# Patient Record
Sex: Male | Born: 1955 | Race: White | Hispanic: No | Marital: Married | Smoking: Never smoker
Health system: Southern US, Community
[De-identification: ages and names within clinical notes are randomized; demographics above are authoritative.]

## PROBLEM LIST (undated history)

## (undated) DIAGNOSIS — R911 Solitary pulmonary nodule: Secondary | ICD-10-CM

## (undated) HISTORY — DX: Solitary pulmonary nodule: R91.1

## (undated) HISTORY — PX: VASECTOMY: SHX75

---

## 1991-06-10 HISTORY — PX: FRACTURE SURGERY: SHX138

## 1991-06-10 HISTORY — PX: SPINE SURGERY: SHX786

## 1999-06-24 ENCOUNTER — Ambulatory Visit (HOSPITAL_BASED_OUTPATIENT_CLINIC_OR_DEPARTMENT_OTHER): Admission: RE | Admit: 1999-06-24 | Discharge: 1999-06-24 | Payer: Self-pay | Admitting: Orthopedic Surgery

## 2003-06-10 HISTORY — PX: FRACTURE SURGERY: SHX138

## 2006-01-21 ENCOUNTER — Ambulatory Visit: Payer: Self-pay | Admitting: Cardiology

## 2009-07-19 LAB — HM COLONOSCOPY

## 2010-06-30 ENCOUNTER — Encounter: Payer: Self-pay | Admitting: Cardiology

## 2013-09-19 ENCOUNTER — Encounter: Payer: Self-pay | Admitting: Physician Assistant

## 2013-09-19 ENCOUNTER — Ambulatory Visit (INDEPENDENT_AMBULATORY_CARE_PROVIDER_SITE_OTHER): Payer: BC Managed Care – PPO | Admitting: Physician Assistant

## 2013-09-19 VITALS — BP 124/84 | HR 84 | Temp 98.1°F | Resp 18 | Ht 68.0 in | Wt 204.0 lb

## 2013-09-19 DIAGNOSIS — Z0000 Encounter for general adult medical examination without abnormal findings: Secondary | ICD-10-CM

## 2013-09-19 DIAGNOSIS — Z1212 Encounter for screening for malignant neoplasm of rectum: Secondary | ICD-10-CM

## 2013-09-19 DIAGNOSIS — R1032 Left lower quadrant pain: Secondary | ICD-10-CM

## 2013-09-19 DIAGNOSIS — R198 Other specified symptoms and signs involving the digestive system and abdomen: Secondary | ICD-10-CM

## 2013-09-19 DIAGNOSIS — Z125 Encounter for screening for malignant neoplasm of prostate: Secondary | ICD-10-CM

## 2013-09-19 DIAGNOSIS — Z1211 Encounter for screening for malignant neoplasm of colon: Secondary | ICD-10-CM

## 2013-09-19 LAB — COMPLETE METABOLIC PANEL WITH GFR
ALT: 20 U/L (ref 0–53)
AST: 20 U/L (ref 0–37)
Albumin: 4.1 g/dL (ref 3.5–5.2)
Alkaline Phosphatase: 59 U/L (ref 39–117)
BUN: 13 mg/dL (ref 6–23)
CO2: 25 mEq/L (ref 19–32)
Calcium: 9.5 mg/dL (ref 8.4–10.5)
Chloride: 104 mEq/L (ref 96–112)
Creat: 1.04 mg/dL (ref 0.50–1.35)
GFR, Est African American: >89 mL/min
GFR, Est Non African American: 80 mL/min
Glucose, Bld: 98 mg/dL (ref 70–99)
Potassium: 4.9 mEq/L (ref 3.5–5.3)
Sodium: 138 mEq/L (ref 135–145)
Total Bilirubin: 0.8 mg/dL (ref 0.2–1.2)
Total Protein: 6.4 g/dL (ref 6.0–8.3)

## 2013-09-19 LAB — CBC WITH DIFFERENTIAL/PLATELET
Basophils Absolute: 0.1 10*3/uL (ref 0.0–0.1)
Basophils Relative: 1 % (ref 0–1)
EOS PCT: 3 % (ref 0–5)
Eosinophils Absolute: 0.2 10*3/uL (ref 0.0–0.7)
HEMATOCRIT: 42.2 % (ref 39.0–52.0)
Hemoglobin: 14.4 g/dL (ref 13.0–17.0)
LYMPHS ABS: 1.8 10*3/uL (ref 0.7–4.0)
LYMPHS PCT: 36 % (ref 12–46)
MCH: 31.7 pg (ref 26.0–34.0)
MCHC: 34.1 g/dL (ref 30.0–36.0)
MCV: 93 fL (ref 78.0–100.0)
Monocytes Absolute: 0.5 10*3/uL (ref 0.1–1.0)
Monocytes Relative: 9 % (ref 3–12)
Neutro Abs: 2.6 10*3/uL (ref 1.7–7.7)
Neutrophils Relative %: 51 % (ref 43–77)
PLATELETS: 284 10*3/uL (ref 150–400)
RBC: 4.54 MIL/uL (ref 4.22–5.81)
RDW: 13.6 % (ref 11.5–15.5)
WBC: 5 10*3/uL (ref 4.0–10.5)

## 2013-09-19 LAB — LIPID PANEL
Cholesterol: 181 mg/dL (ref 0–200)
HDL: 60 mg/dL (ref >39)
LDL Cholesterol: 99 mg/dL (ref 0–99)
Total CHOL/HDL Ratio: 3 Ratio
Triglycerides: 111 mg/dL (ref <150)
VLDL: 22 mg/dL (ref 0–40)

## 2013-09-19 LAB — TSH: TSH: 2.617 u[IU]/mL (ref 0.350–4.500)

## 2013-09-19 NOTE — Progress Notes (Signed)
Patient ID: Benjamin Patrick I Carll MRN: [id number], DOB: 05/21/1956 58 y.o. Date of Encounter: 09/19/2013, 8:52 AM    Chief Complaint: Physical (CPE)  HPI: 58 y.o. y/o white male here for CPE.   Reports that about 3 weeks ago he developed some pain in his left lower quadrant. It was a constant achy discomfort there. Says it has lasted several weeks. Says it has improved but has not completely resolved. Also states that about 3 weeks ago his bowel movements were much narrower and smaller caliber than usual. He never had any diarrhea or loose stools with the pain. Has had no fevers or chills. Has seen no melena or hematochezia.  Otherwise has been feeling good over the past year but no other complaints and medical problems.   Review of Systems: Consitutional: No fever, chills, fatigue, night sweats, lymphadenopathy, or weight changes. Eyes: No visual changes, eye redness, or discharge. ENT/Mouth: Ears: No otalgia, tinnitus, hearing loss, discharge. Nose: No congestion, rhinorrhea, sinus pain, or epistaxis. Throat: No sore throat, post nasal drip, or teeth pain. Cardiovascular: No CP, palpitations, diaphoresis, DOE, edema, orthopnea, PND. Respiratory: No cough, hemoptysis, SOB, or wheezing. Gastrointestinal: No anorexia, dysphagia, reflux, nausea, vomiting, hematemesis, diarrhea, constipation, BRBPR, or melena. Genitourinary: No dysuria, frequency, urgency, hematuria, incontinence, nocturia, decreased urinary stream, discharge, impotence, or testicular pain/masses. Musculoskeletal: No decreased ROM, myalgias, stiffness, joint swelling, or weakness. Skin: No rash, erythema, lesion changes, pain, warmth, jaundice, or pruritis. Neurological: No headache, dizziness, syncope, seizures, tremors, memory loss, coordination problems, or paresthesias. Psychological: No anxiety, depression, hallucinations, SI/HI. Endocrine: No fatigue, polydipsia, polyphagia, polyuria, or known diabetes. All other  systems were reviewed and are otherwise negative.  History reviewed. No pertinent past medical history.   Past Surgical History  Procedure Laterality Date  . Vasectomy    . Fracture surgery Right 06/10/1991    Ankle  . Fracture surgery Right 06/10/2003    Lower Leg  . Fracture surgery Left 06/10/1991    Left Wrist  . Spine surgery N/A 06/10/1991    Lumbar Spine    Home Meds: Only medication is multivitamin. No current outpatient prescriptions on file prior to visit.   No current facility-administered medications on file prior to visit.    Allergies: No Known Allergies  History   Social History  . Marital Status: Married    Spouse Name: N/A    Number of Children: N/A  . Years of Education: N/A   Occupational History  . Not on file.   Social History Main Topics  . Smoking status: Never Smoker   . Smokeless tobacco: Never Used  . Alcohol Use: Yes  . Drug Use: No  . Sexual Activity: Not on file   Other Topics Concern  . Not on file   Social History Narrative   Married. 2 Daughters. 2 Grandchildren.   Works Manufacturing engineerDigging Water Wells.Puts in Pumps   Never Smoked.   No other exercise. --Just YardWork and his job.    Family History  Problem Relation Age of Onset  . Cancer Mother [id number]    Breast Cancer    Physical Exam: Blood pressure 124/84, pulse 84, temperature 98.1 F (36.7 C), temperature source Oral, resp. rate 18, height 5\' 8"  (1.727 m), weight 204 lb (92.534 kg).  General: Well developed, well nourished,WM. Appears in no acute distress. HEENT: Normocephalic, atraumatic. Conjunctiva pink, sclera non-icteric. Pupils 2 mm constricting to 1 mm, round, regular, and equally reactive to light and accomodation. EOMI. Internal auditory canal clear. TMs with  good cone of light and without pathology. Nasal mucosa pink. Nares are without discharge. No sinus tenderness. Oral mucosa pink. Dentition good.Marland Kitchen. Pharynx without exudate.   Neck: Supple. Trachea midline. No thyromegaly. Full  ROM. No lymphadenopathy. Lungs: Clear to auscultation bilaterally without wheezes, rales, or rhonchi. Breathing is of normal effort and unlabored. Cardiovascular: RRR with S1 S2. No murmurs, rubs, or gallops. Distal pulses 2+ symmetrically. No carotid or abdominal bruits. Abdomen: Soft,  non-distended with normoactive bowel sounds. No hepatosplenomegaly or masses. No rebound/guarding. No CVA tenderness. No hernias. Minimal Tenderness Left Lower Quadrant. Rectal: Deferred.  Musculoskeletal: Full range of motion and 5/5 strength throughout. Without swelling, atrophy, tenderness, crepitus, or warmth. Extremities without clubbing, cyanosis, or edema. Calves supple. Skin: Warm and moist without erythema, ecchymosis, wounds, or rash. Neuro: A+Ox3. CN II-XII grossly intact. Moves all extremities spontaneously. Full sensation throughout. Normal gait. DTR 2+ throughout upper and lower extremities. Finger to nose intact. Psych:  Responds to questions appropriately with a normal affect.   Assessment/Plan:  58 y.o. y/o  male here for CPE -1. Visit for preventive health examination  A. Screening Labs: IS fasting.  - CBC with Differential - COMPLETE METABOLIC PANEL WITH GFR - Lipid panel - TSH - Vit D  25 hydroxy (rtn osteoporosis monitoring) - PSA   B. Screening For Prostate Cancer: Check PSA now. Prostate exam deferred.  C. Screening For Colorectal Cancer:  #3 below.  D. Immunizations: Flu-----N/A Tetanus----He received Tdap here 07/2007--UpToDate Pneumococcal--- he has no indication to receive this prior to age 58.- Zostaax----discuss at age 58.    2. Prostate cancer screening - PSA  3. Screening for colorectal cancer [facility] Dr[facility] Wandalee FerdinandSam Ganem  07/19/2009. This showed diverticulosis in the sigmoid colon and in the descending colon. Otherwise normal. Repeat colonoscopy 10 years.  4. Abdominal pain, left lower quadrant I suspect diverticulitis. However  usually with diverticulitis patient's have loose stools. He has had no loose stools and also reports that his stools have been narrowed caliber, Will obtain CT to rule out mass and other etiology and also to possibly make definitive diagnose of diverticulitis . - CT Abdomen Pelvis W Contrast; Future  5. Change in bowel movement - CT Abdomen Pelvis W Contrast; Future   We will call him with results of labs and also results of the CT scan. Otherwise plan followup office visit in one year for her annual physical or sooner if needed.  Signed:   637 Cardinal DriveMary Beth New MarketDixon,PA, [place]  09/19/2013 8:52 AM

## 2013-09-20 LAB — PSA: PSA: 0.61 ng/mL (ref <=4.00)

## 2013-09-20 LAB — VITAMIN D 25 HYDROXY (VIT D DEFICIENCY, FRACTURES): VIT D 25 HYDROXY: 57 ng/mL (ref 30–89)

## 2013-09-22 ENCOUNTER — Ambulatory Visit
Admission: RE | Admit: 2013-09-22 | Discharge: 2013-09-22 | Disposition: A | Discharge status: Home / Self Care | Payer: BC Managed Care – PPO | Source: Ambulatory Visit | Attending: Physician Assistant | Admitting: Physician Assistant

## 2013-09-22 ENCOUNTER — Telehealth: Payer: Self-pay | Admitting: Family Medicine

## 2013-09-22 DIAGNOSIS — R198 Other specified symptoms and signs involving the digestive system and abdomen: Secondary | ICD-10-CM

## 2013-09-22 DIAGNOSIS — R1032 Left lower quadrant pain: Secondary | ICD-10-CM

## 2013-09-22 MED ORDER — IOHEXOL 300 MG/ML  SOLN
125.0000 mL | Freq: Once | INTRAMUSCULAR | Status: AC | PRN
  Administered 2013-09-22: 125 mL via INTRAVENOUS

## 2013-09-22 MED ORDER — HYOSCYAMINE SULFATE ER 0.375 MG PO TB12: 0.3750 mg | ORAL_TABLET | Freq: Two times a day (BID) | ORAL | Status: DC

## 2013-09-22 NOTE — Telephone Encounter (Signed)
Message copied by Donne AnonPLUMMER, KIM M on Thu Sep 22, 2013  4:37 PM ------      Message from: Allayne ButcherIXON, MARY      Created: Thu Sep 22, 2013  4:34 PM       I called patient and spoke with him directly regarding the CT results.      1- he reports that he is still feeling bloated and abdominal pain after he eats.      I discussed that the CT showed no abnormality to explain cause of these symptoms or to explain the symptoms  that he reported at the  visit regarding change in stools.      We also reviewed the fact that he had a colonoscopy 07/19/2009 which showed diverticulosis but otherwise normal.      Therefore,  at this point I recommended that he start an over-the-counter probiotic one daily.      Also recommended that he start prescription medication called hyoscyamine and take as directed.      Told him if symtoms not controlled with these measures, f/u with me and will arrange f/u with Dr. Evette CristalGanem/ his GI            2- also discussed with him that the CT showed an area at the base of the right long that may represent scarring but may represent nodule.      Confirmed with him that he never smoked. Has no family history of lung cancer. He has no personal history of any type of cancer. He has no cough, no hemopytisis.  Therefore he is considered low risk which means that he is to have repeat CT scan in 12 months.            KIM--      Please order for hyoscyamine 0.375mg  one po BID  # 60 +3 refills      Please place reminder to order  CT scan  to be performed in 12 months. ------

## 2013-09-23 NOTE — Telephone Encounter (Signed)
RX to pharmacy and reminder placed for CT

## 2013-10-27 ENCOUNTER — Telehealth: Payer: Self-pay | Admitting: Family Medicine

## 2013-10-27 NOTE — Telephone Encounter (Signed)
Error

## 2013-11-22 ENCOUNTER — Telehealth: Payer: Self-pay | Admitting: Family Medicine

## 2013-11-22 NOTE — Telephone Encounter (Signed)
Error

## 2014-09-13 ENCOUNTER — Telehealth: Payer: Self-pay | Admitting: Family Medicine

## 2014-09-13 NOTE — Telephone Encounter (Signed)
Pt had abnormal CT last year (see result)  Repeat CT recommended for this year.  I called patient to see if he was ready to have this done.  He has an appointment with Dr Tanya NonesPickard on 09/19/14 for CPE. He has opted to discuss this with him at that time.  Will send reminder to provider.

## 2014-09-13 NOTE — Telephone Encounter (Signed)
-----   Message from Donne AnonKim M Madilyne Tadlock, LPN sent at 2/13/08654/17/2015  8:33 AM EDT ----- Needs CT Chest in April 2016 per MBD

## 2014-09-14 ENCOUNTER — Encounter: Payer: Self-pay | Admitting: Family Medicine

## 2014-09-14 DIAGNOSIS — R911 Solitary pulmonary nodule: Secondary | ICD-10-CM | POA: Insufficient documentation

## 2014-09-22 ENCOUNTER — Other Ambulatory Visit: Payer: BLUE CROSS/BLUE SHIELD

## 2014-09-22 DIAGNOSIS — Z0000 Encounter for general adult medical examination without abnormal findings: Secondary | ICD-10-CM

## 2014-09-22 DIAGNOSIS — Z125 Encounter for screening for malignant neoplasm of prostate: Secondary | ICD-10-CM

## 2014-09-22 LAB — COMPLETE METABOLIC PANEL WITH GFR
ALBUMIN: 4.1 g/dL (ref 3.5–5.2)
ALK PHOS: 65 U/L (ref 39–117)
ALT: 25 U/L (ref 0–53)
AST: 22 U/L (ref 0–37)
BILIRUBIN TOTAL: 0.6 mg/dL (ref 0.2–1.2)
BUN: 18 mg/dL (ref 6–23)
CO2: 25 mEq/L (ref 19–32)
Calcium: 9.2 mg/dL (ref 8.4–10.5)
Chloride: 103 mEq/L (ref 96–112)
Creat: 1.16 mg/dL (ref 0.50–1.35)
GFR, Est African American: 81 mL/min
GFR, Est Non African American: 70 mL/min
Glucose, Bld: 89 mg/dL (ref 70–99)
POTASSIUM: 4.6 meq/L (ref 3.5–5.3)
Sodium: 135 mEq/L (ref 135–145)
TOTAL PROTEIN: 6.4 g/dL (ref 6.0–8.3)

## 2014-09-22 LAB — CBC WITH DIFFERENTIAL/PLATELET
BASOS PCT: 0 % (ref 0–1)
Basophils Absolute: 0 10*3/uL (ref 0.0–0.1)
Eosinophils Absolute: 0.2 10*3/uL (ref 0.0–0.7)
Eosinophils Relative: 3 % (ref 0–5)
HCT: 43.4 % (ref 39.0–52.0)
Hemoglobin: 14.7 g/dL (ref 13.0–17.0)
Lymphocytes Relative: 37 % (ref 12–46)
Lymphs Abs: 2.5 10*3/uL (ref 0.7–4.0)
MCH: 32 pg (ref 26.0–34.0)
MCHC: 33.9 g/dL (ref 30.0–36.0)
MCV: 94.6 fL (ref 78.0–100.0)
MONOS PCT: 10 % (ref 3–12)
MPV: 9.8 fL (ref 8.6–12.4)
Monocytes Absolute: 0.7 10*3/uL (ref 0.1–1.0)
NEUTROS PCT: 50 % (ref 43–77)
Neutro Abs: 3.4 10*3/uL (ref 1.7–7.7)
Platelets: 306 10*3/uL (ref 150–400)
RBC: 4.59 MIL/uL (ref 4.22–5.81)
RDW: 13.6 % (ref 11.5–15.5)
WBC: 6.7 10*3/uL (ref 4.0–10.5)

## 2014-09-22 LAB — LIPID PANEL
CHOL/HDL RATIO: 3.6 ratio
CHOLESTEROL: 197 mg/dL (ref 0–200)
HDL: 55 mg/dL (ref >=40)
LDL CALC: 124 mg/dL — AB (ref 0–99)
Triglycerides: 89 mg/dL (ref <150)
VLDL: 18 mg/dL (ref 0–40)

## 2014-09-22 LAB — TSH: TSH: 2.811 u[IU]/mL (ref 0.350–4.500)

## 2014-09-23 LAB — PSA: PSA: 0.53 ng/mL (ref <=4.00)

## 2014-09-28 ENCOUNTER — Encounter: Payer: Self-pay | Admitting: Family Medicine

## 2014-09-28 ENCOUNTER — Ambulatory Visit (INDEPENDENT_AMBULATORY_CARE_PROVIDER_SITE_OTHER): Payer: BLUE CROSS/BLUE SHIELD | Admitting: Family Medicine

## 2014-09-28 VITALS — BP 132/86 | HR 68 | Temp 98.1°F | Resp 18 | Ht 68.0 in | Wt 206.0 lb

## 2014-09-28 DIAGNOSIS — R911 Solitary pulmonary nodule: Secondary | ICD-10-CM | POA: Diagnosis not present

## 2014-09-28 DIAGNOSIS — Z0000 Encounter for general adult medical examination without abnormal findings: Secondary | ICD-10-CM

## 2014-09-28 NOTE — Progress Notes (Signed)
Subjective:    Patient ID: Benjamin Patrick, male    DOB: 1955/06/23, 59 y.o.   MRN: [id number]  HPI  Patient is here today for complete physical exam. I reviewed his CT of abdomen and pelvis from last year. There was a coincidental finding of a possible right lower lobe pulmonary nodule. There is also a cystic lesion in the right kidney.  Radiology had recommended a follow-up chest CT in one year as well as a renal ultrasound to monitor.  Patient's last colonoscopy was in 2011.  Patient's most recent lab work as listed below: Lab on 09/22/2014  Component Date Value Ref Range Status  . Sodium 09/22/2014 135  135 - 145 mEq/L Final  . Potassium 09/22/2014 4.6  3.5 - 5.3 mEq/L Final  . Chloride 09/22/2014 103  96 - 112 mEq/L Final  . CO2 09/22/2014 25  19 - 32 mEq/L Final  . Glucose, Bld 09/22/2014 89  70 - 99 mg/dL Final  . BUN 09/22/2014 18  6 - 23 mg/dL Final  . Creat 09/22/2014 1.16  0.50 - 1.35 mg/dL Final  . Total Bilirubin 09/22/2014 0.6  0.2 - 1.2 mg/dL Final  . Alkaline Phosphatase 09/22/2014 65  39 - 117 U/L Final  . AST 09/22/2014 22  0 - 37 U/L Final  . ALT 09/22/2014 25  0 - 53 U/L Final  . Total Protein 09/22/2014 6.4  6.0 - 8.3 g/dL Final  . Albumin 09/22/2014 4.1  3.5 - 5.2 g/dL Final  . Calcium 09/22/2014 9.2  8.4 - 10.5 mg/dL Final  . GFR, Est African American 09/22/2014 81   Final  . GFR, Est Non African American 09/22/2014 70   Final   Comment:   The estimated GFR is a calculation valid for adults (>=27 years old) that uses the CKD-EPI algorithm to adjust for age and sex. It is   not to be used for children, pregnant women, hospitalized patients,    patients on dialysis, or with rapidly changing kidney function. According to the NKDEP, eGFR >89 is normal, 60-89 shows mild impairment, 30-59 shows moderate impairment, 15-29 shows severe impairment and <15 is ESRD.     . TSH 09/22/2014 2.811  0.350 - 4.500 uIU/mL Final  . Cholesterol 09/22/2014 197  0 - 200 mg/dL  Final   Comment: ATP III Classification:       < 200        mg/dL        Desirable      200 - 239     mg/dL        Borderline High      >= 240        mg/dL        High     . Triglycerides 09/22/2014 89  <150 mg/dL Final  . HDL 09/22/2014 55  >=40 mg/dL Final   ** Please note change in reference range(s). **  . Total CHOL/HDL Ratio 09/22/2014 3.6   Final  . VLDL 09/22/2014 18  0 - 40 mg/dL Final  . LDL Cholesterol 09/22/2014 124* 0 - 99 mg/dL Final   Comment:   Total Cholesterol/HDL Ratio:CHD Risk                        Coronary Heart Disease Risk Table  Men       Women          1/2 Average Risk              3.4        3.3              Average Risk              5.0        4.4           2X Average Risk              9.6        7.1           3X Average Risk             23.4       11.0 Use the calculated Patient Ratio above and the CHD Risk table  to determine the patient's CHD Risk. ATP III Classification (LDL):       < 100        mg/dL         Optimal      100 - 129     mg/dL         Near or Above Optimal      130 - 159     mg/dL         Borderline High      160 - 189     mg/dL         High       > 190        mg/dL         Very High     . WBC 09/22/2014 6.7  4.0 - 10.5 K/uL Final  . RBC 09/22/2014 4.59  4.22 - 5.81 MIL/uL Final  . Hemoglobin 09/22/2014 14.7  13.0 - 17.0 g/dL Final  . HCT 09/22/2014 43.4  39.0 - 52.0 % Final  . MCV 09/22/2014 94.6  78.0 - 100.0 fL Final  . MCH 09/22/2014 32.0  26.0 - 34.0 pg Final  . MCHC 09/22/2014 33.9  30.0 - 36.0 g/dL Final  . RDW 09/22/2014 13.6  11.5 - 15.5 % Final  . Platelets 09/22/2014 306  150 - 400 K/uL Final  . MPV 09/22/2014 9.8  8.6 - 12.4 fL Final  . Neutrophils Relative % 09/22/2014 50  43 - 77 % Final  . Neutro Abs 09/22/2014 3.4  1.7 - 7.7 K/uL Final  . Lymphocytes Relative 09/22/2014 37  12 - 46 % Final  . Lymphs Abs 09/22/2014 2.5  0.7 - 4.0 K/uL Final  . Monocytes Relative 09/22/2014  10  3 - 12 % Final  . Monocytes Absolute 09/22/2014 0.7  0.1 - 1.0 K/uL Final  . Eosinophils Relative 09/22/2014 3  0 - 5 % Final  . Eosinophils Absolute 09/22/2014 0.2  0.0 - 0.7 K/uL Final  . Basophils Relative 09/22/2014 0  0 - 1 % Final  . Basophils Absolute 09/22/2014 0.0  0.0 - 0.1 K/uL Final  . Smear Review 09/22/2014 Criteria for review not met   Final  . PSA 09/22/2014 0.53  <=4.00 ng/mL Final   Comment: Test Methodology: ECLIA PSA (Electrochemiluminescence Immunoassay)   For PSA values from 2.5-4.0, particularly in younger men <24 years old, the AUA and NCCN suggest testing for % Free PSA (3515) and evaluation of the rate of increase in PSA (PSA velocity).     Past Medical History  Diagnosis Date  . Pulmonary nodule, right     see ct 09/2013   Past Surgical History  Procedure Laterality Date  . Vasectomy    . Fracture surgery Right 06/10/1991    Ankle  . Fracture surgery Right 06/10/2003    Lower Leg  . Fracture surgery Left 06/10/1991    Left Wrist  . Spine surgery N/A 06/10/1991    Lumbar Spine   Current Outpatient Prescriptions on File Prior to Visit  Medication Sig Dispense Refill  . hyoscyamine (LEVBID) 0.375 MG 12 hr tablet Take 1 tablet (0.375 mg total) by mouth 2 (two) times daily. 60 tablet 3  . Multiple Vitamin (MULTIVITAMIN) tablet Take 1 tablet by mouth daily.     No current facility-administered medications on file prior to visit.   No Known Allergies History   Social History  . Marital Status: Married    Spouse Name: N/A  . Number of Children: N/A  . Years of Education: N/A   Occupational History  . Not on file.   Social History Main Topics  . Smoking status: Never Smoker   . Smokeless tobacco: Never Used  . Alcohol Use: Yes  . Drug Use: No  . Sexual Activity: Not on file   Other Topics Concern  . Not on file   Social History Narrative   Married. 2 Daughters. 2 Grandchildren.   Works Horticulturist, commercial.Puts in Pumps   Never Smoked.    No other exercise. --Just YardWork and his job.   Family History  Problem Relation Age of Onset  . Cancer Mother 84    Breast Cancer     Review of Systems  All other systems reviewed and are negative.      Objective:   Physical Exam  Constitutional: He is oriented to person, place, and time. He appears well-developed and well-nourished. No distress.  HENT:  Head: Normocephalic and atraumatic.  Right Ear: External ear normal.  Left Ear: External ear normal.  Nose: Nose normal.  Mouth/Throat: Oropharynx is clear and moist. No oropharyngeal exudate.  Eyes: Conjunctivae and EOM are normal. Pupils are equal, round, and reactive to light. Right eye exhibits no discharge. Left eye exhibits no discharge. No scleral icterus.  Neck: Normal range of motion. Neck supple. No JVD present. No tracheal deviation present. No thyromegaly present.  Cardiovascular: Normal rate, regular rhythm, normal heart sounds and intact distal pulses.  Exam reveals no gallop and no friction rub.   No murmur heard. Pulmonary/Chest: Effort normal and breath sounds normal. No stridor. No respiratory distress. He has no wheezes. He has no rales. He exhibits no tenderness.  Abdominal: Soft. Bowel sounds are normal. He exhibits no distension and no mass. There is no tenderness. There is no rebound and no guarding.  Genitourinary: Rectum normal, prostate normal and penis normal.  Musculoskeletal: Normal range of motion. He exhibits no edema or tenderness.  Lymphadenopathy:    He has no cervical adenopathy.  Neurological: He is alert and oriented to person, place, and time. He has normal reflexes. He displays normal reflexes. No cranial nerve deficit. He exhibits normal muscle tone. Coordination normal.  Skin: Skin is warm. No rash noted. He is not diaphoretic. No erythema. No pallor.  Psychiatric: He has a normal mood and affect. His behavior is normal. Judgment and thought content normal.  Vitals reviewed.          Assessment & Plan:  Routine general medical examination at a health care facility  Pulmonary nodule, right -  Plan: CT Chest W Contrast  Patient's lab work is excellent. His physical exam is completely normal. I will schedule the patient for a repeat CT scan to reevaluate the pulmonary nodule. I believe the cyst that they saw his CT scan is a coincidental finding and we will not reimage that at the present time as the patient is asymptomatic.

## 2014-10-06 ENCOUNTER — Other Ambulatory Visit: Payer: Self-pay

## 2015-02-14 IMAGING — CT CT ABD-PELV W/ CM
3 of 5 series · 12 of 36 positions shown, 18 images · IV contrast (READICAT/WATER & [ID] OMNI 300)
Comparison: None.

CLINICAL DATA: Left lower quadrant pain for 1 month. Change in
bowel movements.

EXAM:
CT ABDOMEN AND PELVIS WITH CONTRAST
TECHNIQUE: Multidetector CT imaging of the abdomen and pelvis was performed
using the standard protocol following bolus administration of
intravenous contrast.
CONTRAST:  125mL OMNIPAQUE IOHEXOL 300 MG/ML  SOLN

[Series 3: abd/pelvis with · axial · 0.82mm/px · z∈[-388,-28]mm · 8 of 94 slices shown, 13 images]
[im 11/94  soft-tissue]
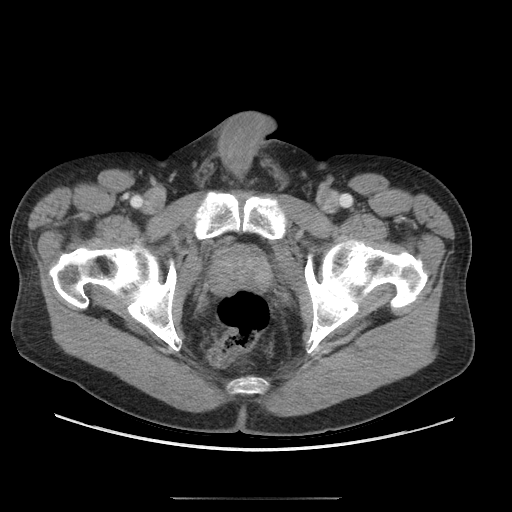
[im 11/94  bone]
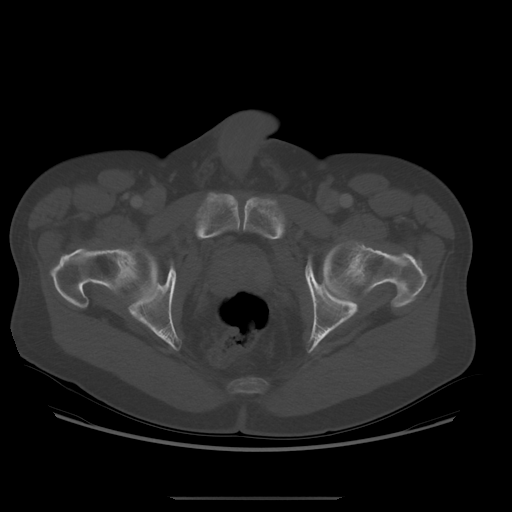
[im 21/94  soft-tissue]
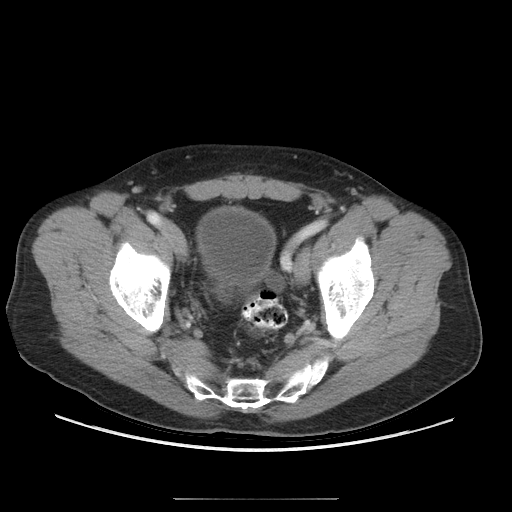
[im 32/94  soft-tissue]
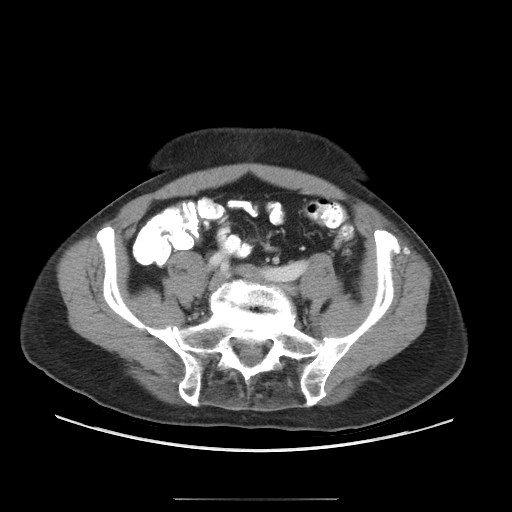
[im 42/94  soft-tissue]
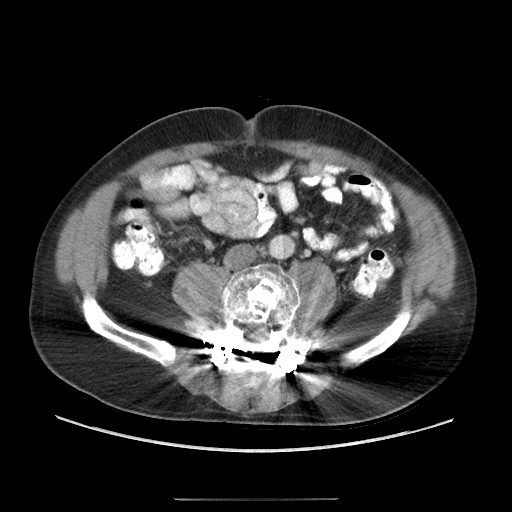
[im 52/94  soft-tissue]
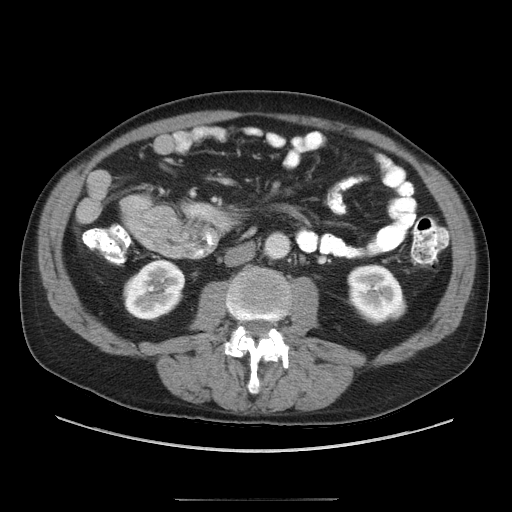
[im 52/94  lung]
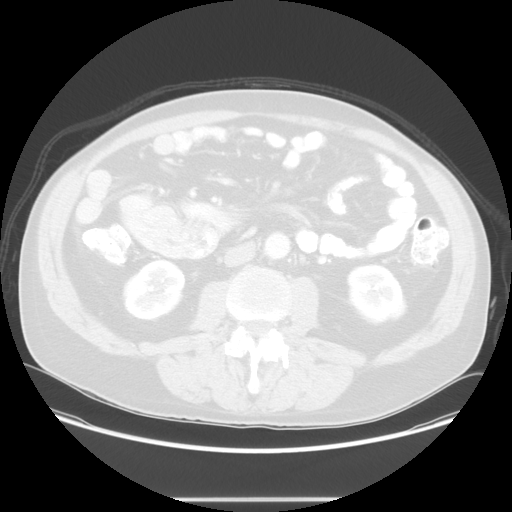
[im 63/94  soft-tissue]
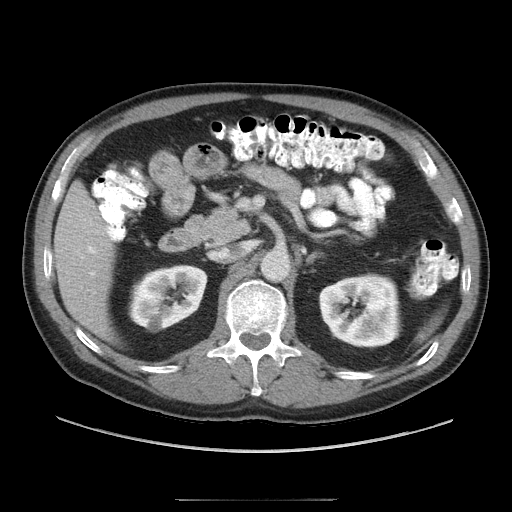
[im 63/94  lung]
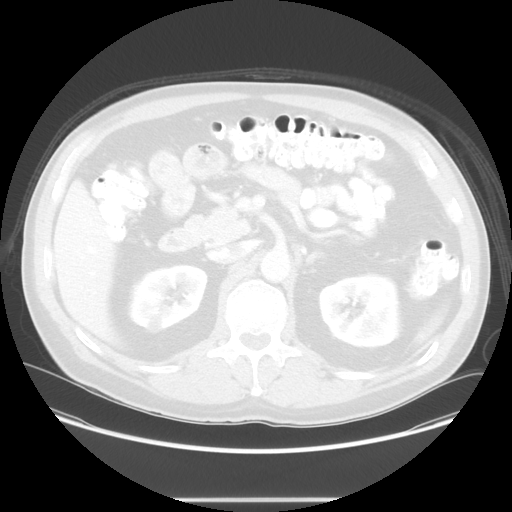
[im 73/94  soft-tissue]
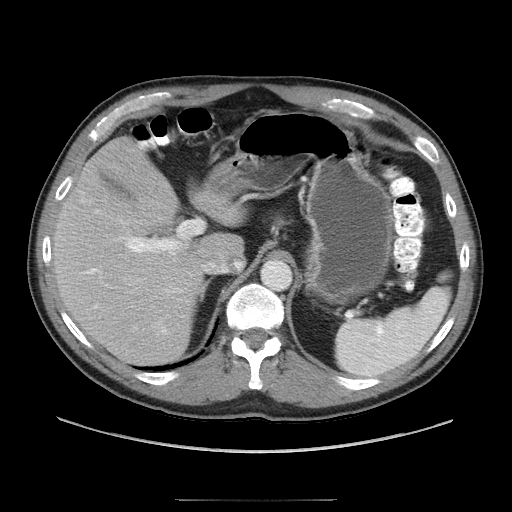
[im 73/94  lung]
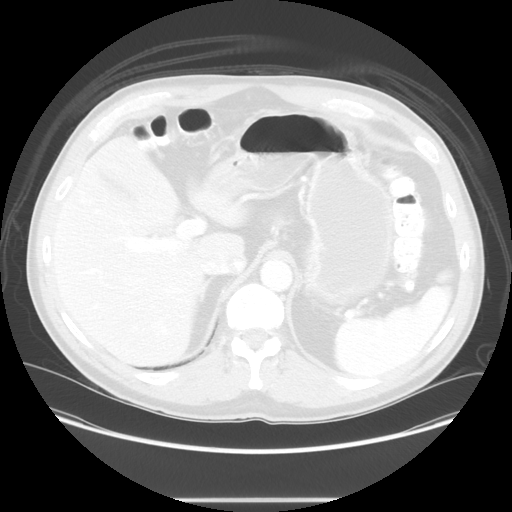
[im 83/94  soft-tissue]
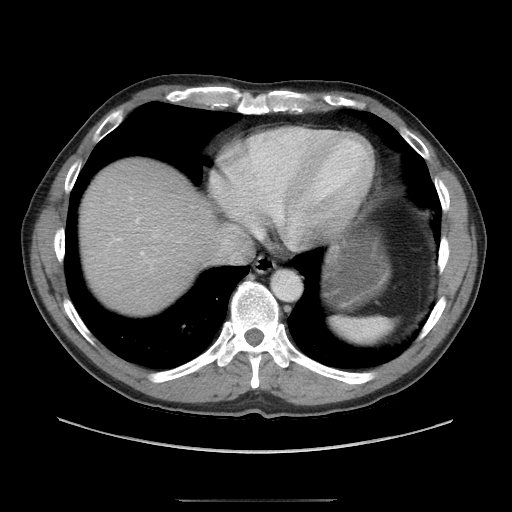
[im 83/94  lung]
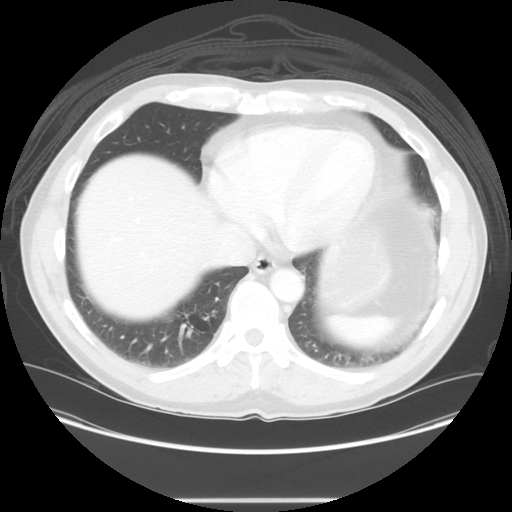

[Series 601: coronal body · coronal · 1.00mm/px · 1 of 123 slices shown, 2 images]
[im 41/123  soft-tissue]
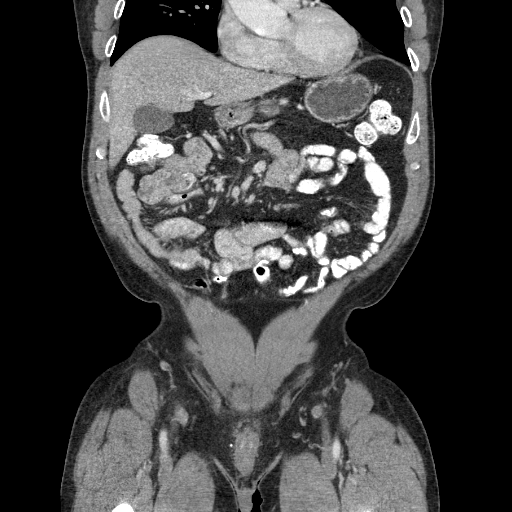
[im 41/123  bone]
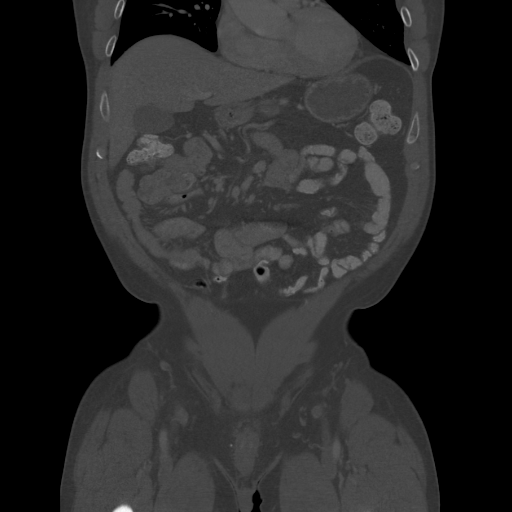

[Series 602: sagittal body · sagittal · 1.00mm/px · 3 of 161 slices shown]
[im 11/161  soft-tissue]
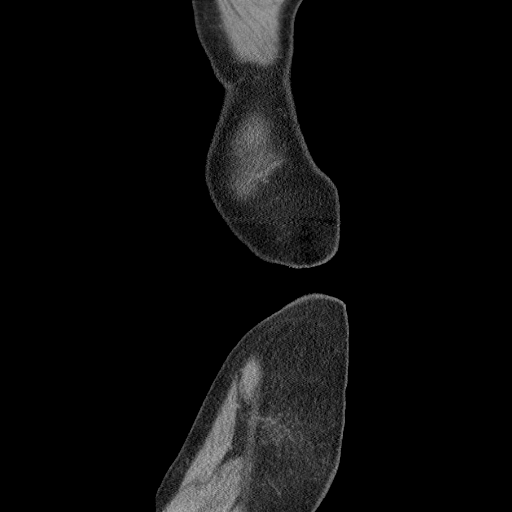
[im 33/161  soft-tissue]
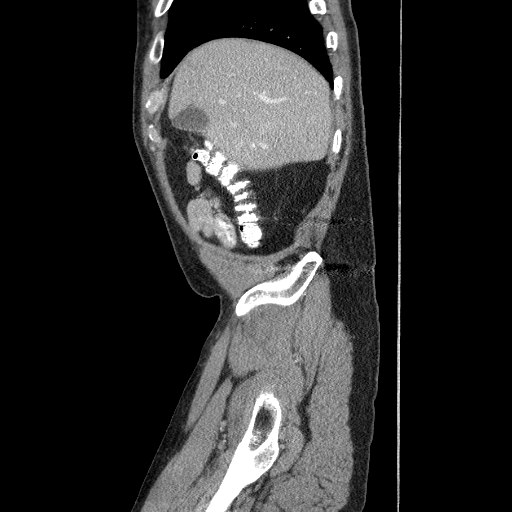
[im 54/161  soft-tissue]
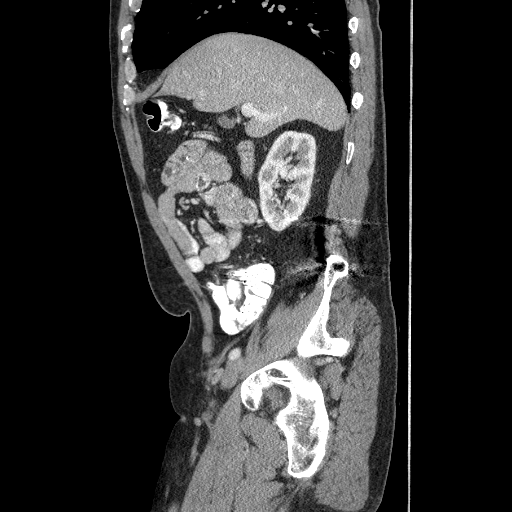

[12 of 36 positions shown; findings below may reference images not displayed]

FINDINGS: Lower Chest: Possible scarring in the right lower lobe. This has a
somewhat nodular morphology, measuring 6 mm on image 14. Mild
cardiomegaly, without pericardial or pleural effusion.

Abdomen/Pelvis: Normal liver, spleen, stomach, pancreas,
gallbladder, biliary tract, adrenal glands. Posterior right upper
pole renal lesion is slightly greater than fluid density, but
well-circumscribed. Measures 11 mm on image 33 and coronal image 85.

Normal left kidney. No retroperitoneal or retrocrural adenopathy.
Underdistended rectosigmoid junction, including on image 78. No
dominant mass in this area. Diverticula throughout the left-sided
colon. No evidence of diverticulitis. Normal terminal ileum and
appendix. Normal small bowel without abdominal ascites.

Small bilateral hydroceles, possibly physiologic. No pelvic
adenopathy. A left-sided bladder diverticulum which measures 1.8 cm.
Normal prostate, without significant free pelvic fluid.

Bones/Musculoskeletal: Lumbar spine fixation at L3-5. A mild to
moderate L4 compression deformity. There is also on L1 compression
deformity which is moderate and demonstrates mild ventral canal
encroachment.
IMPRESSION: 1.  No acute process or explanation for left lower quadrant pain
2. Underdistended rectosigmoid region, without dominant mass
throughout the colon. No definite explanation for change in bowel
habits.
3. Right renal lesion which is favored to represent a minimally
complex cyst but is technically indeterminate. Consider followup
with ultrasound at 1 year to confirm size stability of this lesion.
If definitive characterization is desired, the test of choice is pre
and post contrast abdominal MRI.
4. Small left-sided bladder diverticulum.
5. Lumbar compression deformities and prior lumbar spine fixation.
6. Right base scarring with a nodular component. Cannot exclude
concurrent pulmonary nodule. If the patient is at high risk for
bronchogenic carcinoma, follow-up chest CT at 6-12 months is
recommended. If the patient is at low risk for bronchogenic
carcinoma, follow-up chest CT at 12 months is recommended. This
recommendation follows the consensus statement: "Guidelines for
Management of Small Pulmonary Nodules Detected on CT Scans: A
Statement from the [HOSPITAL]" as published in
1[facility]iology[facility];[DATE]. Online at:
[URL]

## 2015-12-14 ENCOUNTER — Telehealth: Payer: Self-pay | Admitting: Family Medicine

## 2015-12-14 NOTE — Progress Notes (Signed)
Called and spoke to pt and he will discuss with wife and I will call back next week as he is due for a CPE as well and we will get that scheduled then.  

## 2015-12-14 NOTE — Telephone Encounter (Signed)
Called and spoke to pt and he will discuss with wife and I will call back next week as he is due for a CPE as well and we will get that scheduled then.

## 2015-12-14 NOTE — Telephone Encounter (Signed)
-----   Message from Dorena BodoMary B Dixon, PA-C sent at 12/03/2015  4:51 PM EDT ----- Pt was to have Chest CT to f/u Lung Nodule that was seen on CT in 2015.  Chest CT now showing up in Dr. Felisa BonierPickards OverDue Result Folder.  Please call pt and inform him of need for f/u CT. Find out if he is agreeable to go for this test or not.    ----- Message -----    From: SYSTEM    Sent: 12/03/2015  12:04 AM      To: Donita BrooksWarren T Pickard, MD

## 2016-05-23 ENCOUNTER — Other Ambulatory Visit: Payer: Self-pay | Admitting: Family Medicine

## 2016-05-23 ENCOUNTER — Other Ambulatory Visit: Payer: BLUE CROSS/BLUE SHIELD

## 2016-05-23 DIAGNOSIS — Z0000 Encounter for general adult medical examination without abnormal findings: Secondary | ICD-10-CM

## 2016-05-23 LAB — PSA: PSA: 0.5 ng/mL (ref <=4.0)

## 2016-05-23 LAB — CBC WITH DIFFERENTIAL/PLATELET
BASOS PCT: 0 %
Basophils Absolute: 0 cells/uL (ref 0–200)
Eosinophils Absolute: 159 cells/uL (ref 15–500)
Eosinophils Relative: 3 %
HEMATOCRIT: 43.8 % (ref 38.5–50.0)
Hemoglobin: 14.5 g/dL (ref 13.0–17.0)
Lymphocytes Relative: 45 %
Lymphs Abs: 2385 cells/uL (ref 850–3900)
MCH: 31.9 pg (ref 27.0–33.0)
MCHC: 33.1 g/dL (ref 32.0–36.0)
MCV: 96.3 fL (ref 80.0–100.0)
MONO ABS: 530 {cells}/uL (ref 200–950)
MPV: 9.6 fL (ref 7.5–12.5)
Monocytes Relative: 10 %
Neutro Abs: 2226 cells/uL (ref 1500–7800)
Neutrophils Relative %: 42 %
Platelets: 291 10*3/uL (ref 140–400)
RBC: 4.55 MIL/uL (ref 4.20–5.80)
RDW: 13.2 % (ref 11.0–15.0)
WBC: 5.3 10*3/uL (ref 3.8–10.8)

## 2016-05-23 LAB — LIPID PANEL
Cholesterol: 199 mg/dL (ref <200)
HDL: 58 mg/dL (ref >40)
LDL Cholesterol: 127 mg/dL — ABNORMAL HIGH (ref <100)
Total CHOL/HDL Ratio: 3.4 Ratio (ref <5.0)
Triglycerides: 69 mg/dL (ref <150)
VLDL: 14 mg/dL (ref <30)

## 2016-05-23 LAB — COMPLETE METABOLIC PANEL WITH GFR
ALK PHOS: 67 U/L (ref 40–115)
ALT: 31 U/L (ref 9–46)
AST: 28 U/L (ref 10–35)
Albumin: 4 g/dL (ref 3.6–5.1)
BILIRUBIN TOTAL: 0.6 mg/dL (ref 0.2–1.2)
BUN: 21 mg/dL (ref 7–25)
CO2: 26 mmol/L (ref 20–31)
Calcium: 9.2 mg/dL (ref 8.6–10.3)
Chloride: 107 mmol/L (ref 98–110)
Creat: 1.23 mg/dL (ref 0.70–1.33)
GFR, Est African American: 75 mL/min (ref >=60)
GFR, Est Non African American: 65 mL/min (ref >=60)
GLUCOSE: 98 mg/dL (ref 70–99)
Potassium: 4.8 mmol/L (ref 3.5–5.3)
SODIUM: 141 mmol/L (ref 135–146)
Total Protein: 6.5 g/dL (ref 6.1–8.1)

## 2016-05-27 ENCOUNTER — Ambulatory Visit (INDEPENDENT_AMBULATORY_CARE_PROVIDER_SITE_OTHER): Payer: BLUE CROSS/BLUE SHIELD | Admitting: Family Medicine

## 2016-05-27 ENCOUNTER — Encounter: Payer: Self-pay | Admitting: Family Medicine

## 2016-05-27 VITALS — BP 140/96 | HR 68 | Temp 98.6°F | Resp 16 | Ht 70.0 in | Wt 216.0 lb

## 2016-05-27 DIAGNOSIS — Z0000 Encounter for general adult medical examination without abnormal findings: Secondary | ICD-10-CM

## 2016-05-27 NOTE — Progress Notes (Signed)
Subjective:    Patient ID: Benjamin Patrick, male    DOB: 12/03/1955, 60 y.o.   MRN: [id number]  HPI  Patient is here today for complete physical exam.Patient's last colonoscopy was in 2011.  Patient's most recent lab work as listed below: Appointment on 05/23/2016  Component Date Value Ref Range Status  . Sodium 05/23/2016 141  135 - 146 mmol/L Final  . Potassium 05/23/2016 4.8  3.5 - 5.3 mmol/L Final  . Chloride 05/23/2016 107  98 - 110 mmol/L Final  . CO2 05/23/2016 26  20 - 31 mmol/L Final  . Glucose, Bld 05/23/2016 98  70 - 99 mg/dL Final  . BUN 05/23/2016 21  7 - 25 mg/dL Final  . Creat 05/23/2016 1.23  0.70 - 1.33 mg/dL Final   Comment:   For patients > or = 58 years of age: The upper reference limit for Creatinine is approximately 13% higher for people identified as African-American.     . Total Bilirubin 05/23/2016 0.6  0.2 - 1.2 mg/dL Final  . Alkaline Phosphatase 05/23/2016 67  40 - 115 U/L Final  . AST 05/23/2016 28  10 - 35 U/L Final  . ALT 05/23/2016 31  9 - 46 U/L Final  . Total Protein 05/23/2016 6.5  6.1 - 8.1 g/dL Final  . Albumin 05/23/2016 4.0  3.6 - 5.1 g/dL Final  . Calcium 05/23/2016 9.2  8.6 - 10.3 mg/dL Final  . GFR, Est African American 05/23/2016 75  >=60 mL/min Final  . GFR, Est Non African American 05/23/2016 65  >=60 mL/min Final  . Cholesterol 05/23/2016 199  <200 mg/dL Final  . Triglycerides 05/23/2016 69  <150 mg/dL Final  . HDL 05/23/2016 58  >40 mg/dL Final  . Total CHOL/HDL Ratio 05/23/2016 3.4  <5.0 Ratio Final  . VLDL 05/23/2016 14  <30 mg/dL Final  . LDL Cholesterol 05/23/2016 127* <100 mg/dL Final  . WBC 05/23/2016 5.3  3.8 - 10.8 K/uL Final  . RBC 05/23/2016 4.55  4.20 - 5.80 MIL/uL Final  . Hemoglobin 05/23/2016 14.5  13.0 - 17.0 g/dL Final  . HCT 05/23/2016 43.8  38.5 - 50.0 % Final  . MCV 05/23/2016 96.3  80.0 - 100.0 fL Final  . MCH 05/23/2016 31.9  27.0 - 33.0 pg Final  . MCHC 05/23/2016 33.1  32.0 - 36.0 g/dL Final  . RDW  05/23/2016 13.2  11.0 - 15.0 % Final  . Platelets 05/23/2016 291  140 - 400 K/uL Final  . MPV 05/23/2016 9.6  7.5 - 12.5 fL Final  . Neutro Abs 05/23/2016 2226  1,500 - 7,800 cells/uL Final  . Lymphs Abs 05/23/2016 2385  850 - 3,900 cells/uL Final  . Monocytes Absolute 05/23/2016 530  200 - 950 cells/uL Final  . Eosinophils Absolute 05/23/2016 159  15 - 500 cells/uL Final  . Basophils Absolute 05/23/2016 0  0 - 200 cells/uL Final  . Neutrophils Relative % 05/23/2016 42  % Final  . Lymphocytes Relative 05/23/2016 45  % Final  . Monocytes Relative 05/23/2016 10  % Final  . Eosinophils Relative 05/23/2016 3  % Final  . Basophils Relative 05/23/2016 0  % Final  . Smear Review 05/23/2016 Criteria for review not met   Final  . PSA 05/23/2016 0.5  <=4.0 ng/mL Final   Comment:   The total PSA value from this assay system is standardized against the WHO standard. The test result will be approximately 20% lower when compared to the equimolar-standardized total  PSA (Beckman Coulter). Comparison of serial PSA results should be interpreted with this fact in mind.   This test was performed using the Siemens chemiluminescent method. Values obtained from different assay methods cannot be used interchangeably. PSA levels, regardless of value, should not be interpreted as absolute evidence of the presence or absence of disease.       Past Medical History:  Diagnosis Date  . Pulmonary nodule, right    see ct 09/2013   Past Surgical History:  Procedure Laterality Date  . FRACTURE SURGERY Right 06/10/1991   Ankle  . FRACTURE SURGERY Right 06/10/2003   Lower Leg  . FRACTURE SURGERY Left 06/10/1991   Left Wrist  . SPINE SURGERY N/A 06/10/1991   Lumbar Spine  . VASECTOMY     Current Outpatient Prescriptions on File Prior to Visit  Medication Sig Dispense Refill  . Multiple Vitamin (MULTIVITAMIN) tablet Take 1 tablet by mouth daily.     No current facility-administered medications on file prior to  visit.    No Known Allergies Social History   Social History  . Marital status: Married    Spouse name: N/A  . Number of children: N/A  . Years of education: N/A   Occupational History  . Not on file.   Social History Main Topics  . Smoking status: Never Smoker  . Smokeless tobacco: Never Used  . Alcohol use Yes  . Drug use: No  . Sexual activity: Not on file   Other Topics Concern  . Not on file   Social History Narrative   Married. 2 Daughters. 2 Grandchildren.   Works Horticulturist, commercial.Puts in Pumps   Never Smoked.   No other exercise. --Just YardWork and his job.   Family History  Problem Relation Age of Onset  . Cancer Mother 98    Breast Cancer     Review of Systems  All other systems reviewed and are negative.      Objective:   Physical Exam  Constitutional: He is oriented to person, place, and time. He appears well-developed and well-nourished. No distress.  HENT:  Head: Normocephalic and atraumatic.  Right Ear: External ear normal.  Left Ear: External ear normal.  Nose: Nose normal.  Mouth/Throat: Oropharynx is clear and moist. No oropharyngeal exudate.  Eyes: Conjunctivae and EOM are normal. Pupils are equal, round, and reactive to light. Right eye exhibits no discharge. Left eye exhibits no discharge. No scleral icterus.  Neck: Normal range of motion. Neck supple. No JVD present. No tracheal deviation present. No thyromegaly present.  Cardiovascular: Normal rate, regular rhythm, normal heart sounds and intact distal pulses.  Exam reveals no gallop and no friction rub.   No murmur heard. Pulmonary/Chest: Effort normal and breath sounds normal. No stridor. No respiratory distress. He has no wheezes. He has no rales. He exhibits no tenderness.  Abdominal: Soft. Bowel sounds are normal. He exhibits no distension and no mass. There is no tenderness. There is no rebound and no guarding.  Genitourinary: Rectum normal, prostate normal and penis normal.    Musculoskeletal: Normal range of motion. He exhibits no edema or tenderness.  Lymphadenopathy:    He has no cervical adenopathy.  Neurological: He is alert and oriented to person, place, and time. He has normal reflexes. No cranial nerve deficit. He exhibits normal muscle tone. Coordination normal.  Skin: Skin is warm. No rash noted. He is not diaphoretic. No erythema. No pallor.  Psychiatric: He has a normal mood and affect. His behavior  is normal. Judgment and thought content normal.  Vitals reviewed.         Assessment & Plan:  Gen. medical exam. Blood pressures elevated today. Patient has been monitoring his blood pressure at home and it is always 120/80. I have asked him to eat a low-salt diet. Try to engage in 30 minutes aerobic exercise every day. I recommended he check his blood pressure daily and notify me if is greater than 140/90. Strongly recommended a flu shot but the patient politely declined. His lab work is excellent. Regular anticipatory guidance is provided.

## 2016-08-26 DIAGNOSIS — L814 Other melanin hyperpigmentation: Secondary | ICD-10-CM | POA: Diagnosis not present

## 2016-08-26 DIAGNOSIS — L918 Other hypertrophic disorders of the skin: Secondary | ICD-10-CM | POA: Diagnosis not present

## 2016-08-26 DIAGNOSIS — L821 Other seborrheic keratosis: Secondary | ICD-10-CM | POA: Diagnosis not present

## 2016-08-26 DIAGNOSIS — D225 Melanocytic nevi of trunk: Secondary | ICD-10-CM | POA: Diagnosis not present

## 2017-06-04 ENCOUNTER — Other Ambulatory Visit: Payer: Self-pay

## 2017-06-04 ENCOUNTER — Encounter: Payer: Self-pay | Admitting: Physician Assistant

## 2017-06-04 ENCOUNTER — Ambulatory Visit: Payer: BLUE CROSS/BLUE SHIELD | Admitting: Physician Assistant

## 2017-06-04 VITALS — BP 132/90 | HR 86 | Temp 97.8°F | Resp 14 | Wt 219.4 lb

## 2017-06-04 DIAGNOSIS — J988 Other specified respiratory disorders: Secondary | ICD-10-CM

## 2017-06-04 DIAGNOSIS — R918 Other nonspecific abnormal finding of lung field: Secondary | ICD-10-CM

## 2017-06-04 DIAGNOSIS — R911 Solitary pulmonary nodule: Secondary | ICD-10-CM | POA: Diagnosis not present

## 2017-06-04 DIAGNOSIS — B9689 Other specified bacterial agents as the cause of diseases classified elsewhere: Principal | ICD-10-CM

## 2017-06-04 MED ORDER — AZITHROMYCIN 250 MG PO TABS: ORAL_TABLET | ORAL | 0 refills | Status: DC

## 2017-06-04 NOTE — Progress Notes (Signed)
Patient ID: Benjamin Patrick MRN: [id number], DOB: [birth year], 61 y.o. Date of Encounter: 06/04/2017, 8:41 AM    Chief Complaint:  Chief Complaint  Patient presents with  . chest congestion    [id number]   . Cough     HPI: 61 y.o. year old male presents with above.   He reports that he is having chest congestion and cough.  Is coughing up phlegm.  Says that it is been going on for about a month now.  [place] so wanted to come in and get some medication that he will feel better for that trip.  Thinks that it started out in his head with some congestion in his head and nose but those symptoms have resolved and now it is all in his chest.  Has had no known fevers or chills.  No other associated symptoms or other concerns to address today.     Home Meds:   Outpatient Medications Prior to Visit  Medication Sig Dispense Refill  . Multiple Vitamin (MULTIVITAMIN) tablet Take 1 tablet by mouth daily.     No facility-administered medications prior to visit.     Allergies: No Known Allergies    Review of Systems: See HPI for pertinent ROS. All other ROS negative.    Physical Exam: Blood pressure 132/90, pulse 86, temperature 97.8 F (36.6 C), temperature source Oral, resp. rate 14, weight 99.5 kg (219 lb 6.4 oz), SpO2 98 %., Body mass index is 31.48 kg/m. General:  WM. Appears in no acute distress. HEENT: Normocephalic, atraumatic, eyes without discharge, sclera non-icteric, nares are without discharge. Bilateral auditory canals clear, TM's are without perforation, pearly grey and translucent with reflective cone of light bilaterally. Oral cavity moist, posterior pharynx without exudate, erythema, peritonsillar abscess.  Neck: Supple. No thyromegaly. No lymphadenopathy. Lungs: Clear bilaterally to auscultation without wheezes, rales, or rhonchi. Breathing is unlabored. Heart: Regular rhythm. No murmurs, rubs, or  gallops. Msk:  Strength and tone normal for age. Extremities/Skin: Warm and dry.  Neuro: Alert and oriented X 3. Moves all extremities spontaneously. Gait is normal. CNII-XII grossly in tact. Psych:  Responds to questions appropriately with a normal affect.     ASSESSMENT AND PLAN:  61 y.o. year old male with  1. Bacterial respiratory infection Take antibiotic as directed.  Follow-up if symptoms do not resolve within 1 week after completion of antibiotic. - azithromycin (ZITHROMAX) 250 MG tablet; Day 1: Take 2 daily. Days 2 -5: Take 1 daily.  Dispense: 6 tablet; Refill: 0  2. Pulmonary nodule, right Before I went in to see him for his visit today, I had reviewed his chart.  Saw that he had a CT abdomen and pelvis 09/22/2013.  That showed "possible scarring in the right lower lobe.  Somewhat nodular morphology, measuring 6 mm.  Final impression stated that they could not exclude concurrent pulmonary nodule.  See that Result Note--I then determined that patient was low risk for bronchogenic carcinoma so follow-up chest CT at 12 months was recommended.  Had discussed that with the patient at that time and had ordered follow-up CT but he did not have it done.   Discussed this today and discussed need for follow-up CT and is here agreeable. - CT CHEST NODULE FOLLOW UP LOW DOSE W/O; Future  3. Abnormal findings on diagnostic imaging of lung Before I went in to see him for his visit today, I had reviewed  his chart.  Saw that he had a CT abdomen and pelvis 09/22/2013.  That showed "possible scarring in the right lower lobe.  Somewhat nodular morphology, measuring 6 mm.  Final impression stated that they could not exclude concurrent pulmonary nodule.  See that Result Note--I then determined that patient was low risk for bronchogenic carcinoma so follow-up chest CT at 12 months was recommended.  Had discussed that with the patient at that time and had ordered follow-up CT but he did not have it done.     Discussed this today and discussed need for follow-up CT and is here agreeable.  - CT CHEST NODULE FOLLOW UP LOW DOSE W/O; Future   Signed, [id number]Mary Beth MarlandDixon, [place], [facility] 06/04/2017 8:41 AM

## 2017-07-19 DIAGNOSIS — J019 Acute sinusitis, unspecified: Secondary | ICD-10-CM | POA: Diagnosis not present

## 2017-08-27 DIAGNOSIS — L821 Other seborrheic keratosis: Secondary | ICD-10-CM | POA: Diagnosis not present

## 2017-08-27 DIAGNOSIS — D1801 Hemangioma of skin and subcutaneous tissue: Secondary | ICD-10-CM | POA: Diagnosis not present

## 2017-08-27 DIAGNOSIS — L918 Other hypertrophic disorders of the skin: Secondary | ICD-10-CM | POA: Diagnosis not present

## 2017-08-27 DIAGNOSIS — D225 Melanocytic nevi of trunk: Secondary | ICD-10-CM | POA: Diagnosis not present

## 2018-01-27 DIAGNOSIS — H5202 Hypermetropia, left eye: Secondary | ICD-10-CM | POA: Diagnosis not present

## 2018-01-27 DIAGNOSIS — H524 Presbyopia: Secondary | ICD-10-CM | POA: Diagnosis not present

## 2018-01-27 DIAGNOSIS — H52201 Unspecified astigmatism, right eye: Secondary | ICD-10-CM | POA: Diagnosis not present

## 2018-03-22 ENCOUNTER — Other Ambulatory Visit: Payer: BLUE CROSS/BLUE SHIELD

## 2018-03-23 ENCOUNTER — Other Ambulatory Visit: Payer: Self-pay

## 2018-03-23 ENCOUNTER — Other Ambulatory Visit: Payer: BLUE CROSS/BLUE SHIELD

## 2018-03-23 DIAGNOSIS — Z0000 Encounter for general adult medical examination without abnormal findings: Secondary | ICD-10-CM

## 2018-03-24 ENCOUNTER — Ambulatory Visit (INDEPENDENT_AMBULATORY_CARE_PROVIDER_SITE_OTHER): Payer: BLUE CROSS/BLUE SHIELD | Admitting: Physician Assistant

## 2018-03-24 ENCOUNTER — Encounter: Payer: Self-pay | Admitting: Physician Assistant

## 2018-03-24 VITALS — BP 140/90 | HR 91 | Temp 98.0°F | Resp 16 | Ht 68.25 in | Wt 218.0 lb

## 2018-03-24 DIAGNOSIS — Z0000 Encounter for general adult medical examination without abnormal findings: Secondary | ICD-10-CM | POA: Diagnosis not present

## 2018-03-24 DIAGNOSIS — Z23 Encounter for immunization: Secondary | ICD-10-CM

## 2018-03-24 DIAGNOSIS — E038 Other specified hypothyroidism: Secondary | ICD-10-CM | POA: Insufficient documentation

## 2018-03-24 DIAGNOSIS — E039 Hypothyroidism, unspecified: Secondary | ICD-10-CM

## 2018-03-24 LAB — LIPID PANEL
Cholesterol: 228 mg/dL — ABNORMAL HIGH (ref <200)
HDL: 55 mg/dL (ref >40)
LDL CHOLESTEROL (CALC): 150 mg/dL — AB
Non-HDL Cholesterol (Calc): 173 mg/dL (calc) — ABNORMAL HIGH (ref <130)
Total CHOL/HDL Ratio: 4.1 (calc) (ref <5.0)
Triglycerides: 117 mg/dL (ref <150)

## 2018-03-24 LAB — CBC WITH DIFFERENTIAL/PLATELET
Basophils Absolute: 51 cells/uL (ref 0–200)
Basophils Relative: 0.8 %
EOS PCT: 2.8 %
Eosinophils Absolute: 179 cells/uL (ref 15–500)
HCT: 44.2 % (ref 38.5–50.0)
Hemoglobin: 15.3 g/dL (ref 13.2–17.1)
Lymphs Abs: 2445 cells/uL (ref 850–3900)
MCH: 32.7 pg (ref 27.0–33.0)
MCHC: 34.6 g/dL (ref 32.0–36.0)
MCV: 94.4 fL (ref 80.0–100.0)
MONOS PCT: 9.2 %
MPV: 10 fL (ref 7.5–12.5)
NEUTROS PCT: 49 %
Neutro Abs: 3136 cells/uL (ref 1500–7800)
Platelets: 312 10*3/uL (ref 140–400)
RBC: 4.68 10*6/uL (ref 4.20–5.80)
RDW: 12.3 % (ref 11.0–15.0)
Total Lymphocyte: 38.2 %
WBC mixed population: 589 cells/uL (ref 200–950)
WBC: 6.4 10*3/uL (ref 3.8–10.8)

## 2018-03-24 LAB — COMPLETE METABOLIC PANEL WITH GFR
AG Ratio: 1.6 (calc) (ref 1.0–2.5)
ALT: 24 U/L (ref 9–46)
AST: 20 U/L (ref 10–35)
Albumin: 4.2 g/dL (ref 3.6–5.1)
Alkaline phosphatase (APISO): 55 U/L (ref 40–115)
BUN: 17 mg/dL (ref 7–25)
CALCIUM: 9.2 mg/dL (ref 8.6–10.3)
CO2: 26 mmol/L (ref 20–32)
CREATININE: 1.24 mg/dL (ref 0.70–1.33)
Chloride: 103 mmol/L (ref 98–110)
GFR, Est African American: 73 mL/min/{1.73_m2} (ref >=60)
GFR, Est Non African American: 63 mL/min/{1.73_m2} (ref >=60)
Globulin: 2.6 g/dL (calc) (ref 1.9–3.7)
Glucose, Bld: 90 mg/dL (ref 65–99)
Potassium: 5 mmol/L (ref 3.5–5.3)
Sodium: 137 mmol/L (ref 135–146)
TOTAL PROTEIN: 6.8 g/dL (ref 6.1–8.1)
Total Bilirubin: 0.7 mg/dL (ref 0.2–1.2)

## 2018-03-24 LAB — TEST AUTHORIZATION

## 2018-03-24 LAB — HEPATITIS C ANTIBODY
Hepatitis C Ab: NONREACTIVE
SIGNAL TO CUT-OFF: 0.01 (ref <1.00)

## 2018-03-24 LAB — PSA: PSA: 0.8 ng/mL (ref <=4.0)

## 2018-03-24 LAB — T4, FREE: Free T4: 1.1 ng/dL (ref 0.8–1.8)

## 2018-03-24 LAB — TSH: TSH: 4.58 mIU/L — ABNORMAL HIGH (ref 0.40–4.50)

## 2018-03-24 NOTE — Addendum Note (Signed)
Addended by: Phineas Semen A on: 03/24/2018 05:00 PM   Modules accepted: Orders

## 2018-03-24 NOTE — Progress Notes (Signed)
Patient ID: Benjamin Patrick MRN: [id number], DOB: 04-30-1956 62 y.o. Date of Encounter: 03/24/2018, 8:59 AM    Chief Complaint: Physical (CPE)  HPI: 62 y.o. y/o male here for CPE.   He presents for complete physical exam for preventive care.  Has no specific concerns to address.  States that he has been feeling well.  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    Review of Systems: Consitutional: No fever, chills, fatigue, night sweats, lymphadenopathy, or weight changes. Eyes: No visual changes, eye redness, or discharge. ENT/Mouth: Ears: No otalgia, tinnitus, hearing loss, discharge. Nose: No congestion, rhinorrhea, sinus pain, or epistaxis. Throat: No sore throat, post nasal drip, or teeth pain. Cardiovascular: No CP, palpitations, diaphoresis, DOE, edema, orthopnea, PND. Respiratory: No cough, hemoptysis, SOB, or wheezing. Gastrointestinal: No anorexia, dysphagia, reflux, pain, nausea, vomiting, hematemesis, diarrhea, constipation, BRBPR, or melena. Genitourinary: No dysuria, frequency, urgency, hematuria, incontinence, nocturia, decreased urinary stream, discharge, impotence, or testicular pain/masses. Musculoskeletal: No decreased ROM, myalgias, stiffness, joint swelling, or weakness. Skin: No rash, erythema, lesion changes, pain, warmth, jaundice, or pruritis. Neurological: No headache, dizziness, syncope, seizures, tremors, memory loss, coordination problems, or paresthesias. Psychological: No anxiety, depression, hallucinations, SI/HI. Endocrine: No fatigue, polydipsia, polyphagia, polyuria, or known diabetes. All other systems were reviewed and are otherwise negative.  Past Medical History:  Diagnosis Date  . Pulmonary nodule, right    see ct 09/2013     Past Surgical History:  Procedure Laterality Date  . FRACTURE SURGERY Right 06/10/1991   Ankle  . FRACTURE SURGERY Right 06/10/2003   Lower Leg  . FRACTURE SURGERY Left 06/10/1991   Left  Wrist  . SPINE SURGERY N/A 06/10/1991   Lumbar Spine  . VASECTOMY      Home Meds:  Outpatient Medications Prior to Visit  Medication Sig Dispense Refill  . Multiple Vitamin (MULTIVITAMIN) tablet Take 1 tablet by mouth daily.    Marland Kitchen azithromycin (ZITHROMAX) 250 MG tablet Day 1: Take 2 daily. Days 2 -5: Take 1 daily. 6 tablet 0   No facility-administered medications prior to visit.     Allergies: No Known Allergies  Social History   Socioeconomic History  . Marital status: Married    Spouse name: Not on file  . Number of children: Not on file  . Years of education: Not on file  . Highest education level: Not on file  Occupational History  . Not on file  Social Needs  . Financial resource strain: Not on file  . Food insecurity:    Worry: Not on file    Inability: Not on file  . Transportation needs:    Medical: Not on file    Non-medical: Not on file  Tobacco Use  . Smoking status: Never Smoker  . Smokeless tobacco: Never Used  Substance and Sexual Activity  . Alcohol use: Yes  . Drug use: No  . Sexual activity: Not on file  Lifestyle  . Physical activity:    Days per week: Not on file    Minutes per session: Not on file  . Stress: Not on file  Relationships  . Social connections:    Talks on phone: Not on file    Gets together: Not on file    Attends religious service: Not on file    Active member of club or organization: Not on file    Attends meetings of clubs or organizations: Not on file    Relationship status: Not on  file  . Intimate partner violence:    Fear of current or ex partner: Not on file    Emotionally abused: Not on file    Physically abused: Not on file    Forced sexual activity: Not on file  Other Topics Concern  . Not on file  Social History Narrative   Married. 2 Daughters. 2 Grandchildren.   Works Manufacturing engineer.Puts in Pumps   Never Smoked.   No other exercise. --Just YardWork and his job.    Family History  Problem Relation  Age of Onset  . Cancer Mother 88       Breast Cancer    Physical Exam: Blood pressure 140/90, pulse 91, temperature 98 F (36.7 C), temperature source Oral, resp. rate 16, height 5' 8.25" (1.734 m), weight 98.9 kg, SpO2 96 %.  General: Well developed, well nourished, in no acute distress. HEENT: Normocephalic, atraumatic. Conjunctiva pink, sclera non-icteric. Pupils 2 mm constricting to 1 mm, round, regular, and equally reactive to light and accomodation. EOMI. Internal auditory canal clear. TMs with good cone of light and without pathology. Nasal mucosa pink. Nares are without discharge. No sinus tenderness. Oral mucosa pink. Neck: Supple. Trachea midline. No thyromegaly. Full ROM. No lymphadenopathy. No carotid bruits. Lungs: Clear to auscultation bilaterally without wheezes, rales, or rhonchi. Breathing is of normal effort and unlabored. Cardiovascular: RRR with S1 S2. No murmurs, rubs, or gallops. Distal pulses 2+ symmetrically. No carotid or abdominal bruits. Abdomen: Soft, non-tender, non-distended with normoactive bowel sounds. No hepatosplenomegaly or masses. No rebound/guarding. No CVA tenderness. No hernias. Musculoskeletal: Full range of motion and 5/5 strength throughout.  Skin: Warm and moist without erythema, ecchymosis, wounds, or rash. Neuro: A+Ox3. CN II-XII grossly intact. Moves all extremities spontaneously. Full sensation throughout. Normal gait.  Psych:  Responds to questions appropriately with a normal affect.   Assessment/Plan:  62 y.o. y/o  male here for CPE   1. Encounter for preventive health examination  A. Screening Labs: He came for fasting labs yesterday.   We have reviewed those results today.   CBC normal.   CMET normal.   PSA normal. Lipid panel shows total cholesterol 228,   HDL 55,    triglyceride 117,    LDL 150  Lipids: Discussed that his HDL is good at 55.  Discussed that his LDL is not excellent but not horrible.  He has no family history of  cardiovascular disease.  His parents are 50 and 32 years old.  He states that he does like to eat quite a bit of beef and does have some fried foods.  Says that he does occasionally go to [employer] and have burger and fries.  Discussed to minimize these.  B. Screening For Prostate Cancer: PSA is normal.  C. Screening For Colorectal Cancer:  He had colonoscopy 07/19/2009.  I am unable to review the report.  Patient reports that it was normal and he was told to repeat 10 years.  Normal.  Repeat 10 years.  D. Immunizations: Flu---------------------- recommended flu vaccine today but he defers. Tetanus----------------last Td was 07/23/2007.  He is agreeable to update this today.  States that he does work around metal a lot. Pneumococcal------  No indication to require pneumonia vaccine until age 65 Shingrix--------------- he is to check with insurance regarding coverage and cost and if wants to proceed with this will get at pharmacy.   2. Subclinical hypothyroidism TSH is barely out of range so this is essentially normal but will have lab  add free T4 to further evaluate. - T4, free   Can wait 1 year for follow-up visit unless needs to follow-up sooner.  Signed:   [address] [place], [place]  03/24/2018 8:59 AM

## 2018-08-12 ENCOUNTER — Ambulatory Visit: Payer: BLUE CROSS/BLUE SHIELD | Admitting: Family Medicine

## 2018-08-12 ENCOUNTER — Ambulatory Visit (HOSPITAL_COMMUNITY)
Admission: RE | Admit: 2018-08-12 | Discharge: 2018-08-12 | Disposition: A | Discharge status: Home / Self Care | Payer: Self-pay | Source: Ambulatory Visit | Attending: Family Medicine | Admitting: Family Medicine

## 2018-08-12 ENCOUNTER — Encounter: Payer: Self-pay | Admitting: Family Medicine

## 2018-08-12 VITALS — BP 136/92 | HR 105 | Temp 99.6°F | Resp 16 | Ht 68.25 in | Wt 225.4 lb

## 2018-08-12 DIAGNOSIS — M791 Myalgia, unspecified site: Secondary | ICD-10-CM | POA: Diagnosis not present

## 2018-08-12 DIAGNOSIS — R05 Cough: Secondary | ICD-10-CM | POA: Diagnosis not present

## 2018-08-12 DIAGNOSIS — J189 Pneumonia, unspecified organism: Secondary | ICD-10-CM | POA: Diagnosis not present

## 2018-08-12 LAB — INFLUENZA A AND B AG, IMMUNOASSAY
INFLUENZA A ANTIGEN: NOT DETECTED
INFLUENZA B ANTIGEN: NOT DETECTED

## 2018-08-12 MED ORDER — DOXYCYCLINE HYCLATE 100 MG PO TABS: 100.0000 mg | ORAL_TABLET | Freq: Two times a day (BID) | ORAL | 0 refills | Status: AC

## 2018-08-12 MED ORDER — BENZONATATE 100 MG PO CAPS: 100.0000 mg | ORAL_CAPSULE | Freq: Three times a day (TID) | ORAL | 0 refills | Status: AC | PRN

## 2018-08-12 NOTE — Progress Notes (Signed)
Patient ID: Benjamin Patrick, male    DOB: May 21, 1956, 63 y.o.   MRN: [id number]  PCP: Dorena Bodo, PA-C  Chief Complaint  Patient presents with  . Cough    Patient in with c/o chest congestion, cough, chills and body aches. Onset of symptoms Monday     Subjective:   Benjamin Patrick is a 63 y.o. male, presents to clinic with CC of uri sx, cough, congestion, HA, sore throat, body aches, subjectrive fever with chills x 2-3 days.  He started taking his wife's amoxicillin 500 mg BID, has taken about 4 or 5 pills.   Has tried Theraflu no improvement  Hx of pneumonia as a young child otherwise no PMHx of lung disease No smoking hx No sick contacts HPI    Patient Active Problem List   Diagnosis Date Noted  . Subclinical hypothyroidism 03/24/2018  . Pulmonary nodule, right   . Prostate cancer screening 09/19/2013  . Screening for colorectal cancer 09/19/2013     Prior to Admission medications   Medication Sig Start Date End Date Taking? Authorizing Provider  Multiple Vitamin (MULTIVITAMIN) tablet Take 1 tablet by mouth daily.   Yes [provider]     No Known Allergies   Family History  Problem Relation Age of Onset  . Cancer Mother 11       Breast Cancer     Social History   Socioeconomic History  . Marital status: Married    Spouse name: Not on file  . Number of children: Not on file  . Years of education: Not on file  . Highest education level: Not on file  Occupational History  . Not on file  Social Needs  . Financial resource strain: Not on file  . Food insecurity:    Worry: Not on file    Inability: Not on file  . Transportation needs:    Medical: Not on file    Non-medical: Not on file  Tobacco Use  . Smoking status: Never Smoker  . Smokeless tobacco: Never Used  Substance and Sexual Activity  . Alcohol use: Yes  . Drug use: No  . Sexual activity: Not on file  Lifestyle  . Physical activity:    Days per week: Not on file   Minutes per session: Not on file  . Stress: Not on file  Relationships  . Social connections:    Talks on phone: Not on file    Gets together: Not on file    Attends religious service: Not on file    Active member of club or organization: Not on file    Attends meetings of clubs or organizations: Not on file    Relationship status: Not on file  . Intimate partner violence:    Fear of current or ex partner: Not on file    Emotionally abused: Not on file    Physically abused: Not on file    Forced sexual activity: Not on file  Other Topics Concern  . Not on file  Social History Narrative   Married. 2 Daughters. 2 Grandchildren.   Works Manufacturing engineer.Puts in Pumps   Never Smoked.   No other exercise. --Just [employer] and his job.     Review of Systems     Objective:    Vitals:   08/12/18 0902  BP: (!) 136/92  Pulse: (!) 105  Resp: 16  Temp: 99.6 F (37.6 C)  TempSrc: Oral  SpO2: 96%  Weight: 225 lb  6 oz (102.2 kg)  Height: 5' 8.25" (1.734 m)      Physical Exam Vitals signs and nursing note reviewed.  Constitutional:      General: He is not in acute distress.    Appearance: Normal appearance. He is well-developed. He is ill-appearing and diaphoretic. He is not toxic-appearing.  HENT:     Head: Normocephalic and atraumatic.     Jaw: No trismus.     Right Ear: Tympanic membrane, ear canal and external ear normal.     Left Ear: Tympanic membrane, ear canal and external ear normal.     Nose: Mucosal edema, congestion and rhinorrhea present.     Right Sinus: No maxillary sinus tenderness or frontal sinus tenderness.     Left Sinus: No maxillary sinus tenderness or frontal sinus tenderness.     Mouth/Throat:     Mouth: Mucous membranes are moist. Mucous membranes are not pale, not dry and not cyanotic.     Pharynx: Uvula midline. Posterior oropharyngeal erythema present. No oropharyngeal exudate or uvula swelling.     Tonsils: No tonsillar exudate or tonsillar  abscesses.  Eyes:     General: Lids are normal.        Right eye: No discharge.        Left eye: No discharge.     Conjunctiva/sclera: Conjunctivae normal.     Pupils: Pupils are equal, round, and reactive to light.  Neck:     Musculoskeletal: Normal range of motion and neck supple.     Trachea: Trachea and phonation normal. No tracheal deviation.  Cardiovascular:     Rate and Rhythm: Normal rate and regular rhythm.     Pulses:          Radial pulses are 2+ on the right side and 2+ on the left side.     Heart sounds: Normal heart sounds. No murmur. No friction rub. No gallop.   Pulmonary:     Effort: Pulmonary effort is normal. No tachypnea, accessory muscle usage or respiratory distress.     Breath sounds: No stridor. Rhonchi and rales present. No decreased breath sounds or wheezing.  Chest:     Chest wall: No tenderness.  Abdominal:     General: Bowel sounds are normal. There is no distension.     Palpations: Abdomen is soft.     Tenderness: There is no abdominal tenderness.  Musculoskeletal: Normal range of motion.  Skin:    General: Skin is warm.     Capillary Refill: Capillary refill takes less than 2 seconds.     Coloration: Skin is not pale.     Findings: No rash.     Nails: There is no clubbing.   Neurological:     Mental Status: He is alert and oriented to person, place, and time.     Motor: No abnormal muscle tone.     Coordination: Coordination normal.     Gait: Gait normal.  Psychiatric:        Speech: Speech normal.        Behavior: Behavior normal. Behavior is cooperative.           Assessment & Plan:      ICD-10-CM   1. Community acquired pneumonia of left lung, unspecified part of lung J18.9 Influenza A and B Ag, Immunoassay    DG Chest 2 View    benzonatate (TESSALON) 100 MG capsule    doxycycline (VIBRA-TABS) 100 MG tablet    HYDROcodone-homatropine (HYCODAN) 5-1.5 MG/5ML syrup  albuterol (PROVENTIL HFA;VENTOLIN HFA) 108 (90 Base) MCG/ACT  inhaler    Severe symptoms x 2-3 days, fever, body aches, severe coughing.  Suspicious for flu, but flu neg here, lung exam with rales to left lung fields -  CXR correlates with this.  Tx with doxy for CAP, cough meds, inhaler for SOB/coughing fits/bronchospasms.  F/up if not improving most of severe constitutional sx in 3-5 days.  D/C amoxicillin   Danelle Berry, PA-C 08/12/18 9:22 AM

## 2018-08-12 NOTE — Patient Instructions (Signed)
Go get the chest X-ray done - We will call you with results and let you know plan  Cough meds prescribed.  Flu was negative - but clinically you do look suspicious for influenza.  You are not high risk and do not require tamiflu, but if there is no pneumonia, I will provide this for you.  There is small potential for benefit, and side effects are usually stomach upset.  If pneumonia - I would prescribe antibiotics  Treatment for either would include pushing fluids, resting, tylenol/ibuprofen for aches, sweats, chills, fever, and over the counter medications for congestion and cough.  I highly recommend mucinex or mucinex DM  While having fever, try to stay home and isolate yourself as much as you can to prevent spread of illness.  Cover your cough and wash you hands frequently.

## 2018-08-13 MED ORDER — HYDROCODONE-HOMATROPINE 5-1.5 MG/5ML PO SYRP: 5.0000 mL | ORAL_SOLUTION | Freq: Three times a day (TID) | ORAL | 0 refills | Status: AC | PRN

## 2018-08-13 MED ORDER — ALBUTEROL SULFATE HFA 108 (90 BASE) MCG/ACT IN AERS: 2.0000 | INHALATION_SPRAY | RESPIRATORY_TRACT | 0 refills | Status: AC | PRN

## 2020-04-17 DIAGNOSIS — H401131 Primary open-angle glaucoma, bilateral, mild stage: Secondary | ICD-10-CM | POA: Diagnosis not present

## 2020-05-17 DIAGNOSIS — H401131 Primary open-angle glaucoma, bilateral, mild stage: Secondary | ICD-10-CM | POA: Diagnosis not present

## 2020-05-23 DIAGNOSIS — Z20822 Contact with and (suspected) exposure to covid-19: Secondary | ICD-10-CM | POA: Diagnosis not present

## 2020-11-02 DIAGNOSIS — Z0000 Encounter for general adult medical examination without abnormal findings: Secondary | ICD-10-CM | POA: Diagnosis not present

## 2020-11-02 DIAGNOSIS — Z13220 Encounter for screening for lipoid disorders: Secondary | ICD-10-CM | POA: Diagnosis not present

## 2020-11-02 DIAGNOSIS — Z125 Encounter for screening for malignant neoplasm of prostate: Secondary | ICD-10-CM | POA: Diagnosis not present

## 2021-02-04 DIAGNOSIS — H34812 Central retinal vein occlusion, left eye, with macular edema: Secondary | ICD-10-CM | POA: Diagnosis not present

## 2021-02-07 DIAGNOSIS — H34812 Central retinal vein occlusion, left eye, with macular edema: Secondary | ICD-10-CM | POA: Diagnosis not present

## 2021-02-07 DIAGNOSIS — D3132 Benign neoplasm of left choroid: Secondary | ICD-10-CM | POA: Diagnosis not present

## 2021-02-07 DIAGNOSIS — H2513 Age-related nuclear cataract, bilateral: Secondary | ICD-10-CM | POA: Diagnosis not present

## 2021-03-11 DIAGNOSIS — H34812 Central retinal vein occlusion, left eye, with macular edema: Secondary | ICD-10-CM | POA: Diagnosis not present

## 2021-04-08 DIAGNOSIS — H34812 Central retinal vein occlusion, left eye, with macular edema: Secondary | ICD-10-CM | POA: Diagnosis not present

## 2021-05-06 DIAGNOSIS — H34812 Central retinal vein occlusion, left eye, with macular edema: Secondary | ICD-10-CM | POA: Diagnosis not present
